# Patient Record
Sex: Male | Born: 1965 | Hispanic: Yes | Marital: Married
Health system: Southern US, Community
[De-identification: ages and names within clinical notes are randomized; demographics above are authoritative.]

---

## 2004-02-20 ENCOUNTER — Other Ambulatory Visit: Payer: Self-pay

## 2004-05-14 ENCOUNTER — Other Ambulatory Visit: Payer: Self-pay

## 2004-12-23 ENCOUNTER — Emergency Department: Payer: Self-pay | Admitting: Emergency Medicine

## 2006-12-20 ENCOUNTER — Emergency Department: Payer: Self-pay | Admitting: Emergency Medicine

## 2006-12-20 ENCOUNTER — Other Ambulatory Visit: Payer: Self-pay

## 2007-10-14 ENCOUNTER — Other Ambulatory Visit: Payer: Self-pay

## 2007-10-14 ENCOUNTER — Emergency Department: Payer: Self-pay | Admitting: Internal Medicine

## 2007-10-19 ENCOUNTER — Ambulatory Visit: Payer: Self-pay | Admitting: Internal Medicine

## 2012-09-06 ENCOUNTER — Emergency Department: Payer: Self-pay | Admitting: Emergency Medicine
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
## 2019-12-10 ENCOUNTER — Ambulatory Visit: Payer: Self-pay | Attending: Internal Medicine

## 2019-12-10 DIAGNOSIS — Z23 Encounter for immunization: Secondary | ICD-10-CM

## 2019-12-10 NOTE — Progress Notes (Signed)
   Covid-[facility] Vaccination [facility]  Name:  Charles Warner    MRN: [id number] DOB: May 28, 1966  12/10/2019  [name] was observed post Covid-19 immunization for 15 minutes without incident. He was provided with Vaccine Information Sheet and instruction to access the V-Safe system.   [name] was instructed to call 911 with any severe reactions post vaccine: Marland Kitchen Difficulty breathing  . Swelling of face and throat  . A fast heartbeat  . A bad rash all over body  . Dizziness and weakness   Immunizations Administered    Name Date Dose VIS Date Route   Pfizer COVID-19 Vaccine 12/10/2019  8:07 AM 0.3 mL 08/26/2019 Intramuscular   Manufacturer: [employer], [employer]   Lot: [id number]   NDC: [address]-5

## 2019-12-31 ENCOUNTER — Ambulatory Visit: Payer: Self-pay | Attending: Internal Medicine

## 2019-12-31 ENCOUNTER — Ambulatory Visit: Payer: Self-pay

## 2019-12-31 DIAGNOSIS — Z23 Encounter for immunization: Secondary | ICD-10-CM

## 2019-12-31 NOTE — Progress Notes (Signed)
   Covid-19 Vaccination Clinic  Name:  Charles Warner    MRN: [id number] DOB: 10-06-1965  12/31/2019  [name] was observed post Covid-19 immunization for 15 minutes   without incident. He was provided with Vaccine Information Sheet and instruction to access the V-Safe system.   [name] was instructed to call 911 with any severe reactions post vaccine: Marland Kitchen Difficulty breathing  . Swelling of face and throat  . A fast heartbeat  . A bad rash all over body  . Dizziness and weakness   Immunizations Administered    Name Date Dose VIS Date Route   Pfizer COVID-19 Vaccine 12/31/2019 10:43 AM 0.3 mL 08/26/2019 Intramuscular   Manufacturer: [employer], [employer]   Lot: [id number]   NDC: [address]-5
